# Patient Record
Sex: Female | Born: 1975 | Race: White | Hispanic: No | Marital: Married | Smoking: Never smoker
Health system: Southern US, Community
[De-identification: ages and names within clinical notes are randomized; demographics above are authoritative.]

## PROBLEM LIST (undated history)

## (undated) DIAGNOSIS — E079 Disorder of thyroid, unspecified: Secondary | ICD-10-CM

## (undated) HISTORY — PX: WISDOM TOOTH EXTRACTION: SHX21

---

## 2000-02-12 ENCOUNTER — Other Ambulatory Visit: Admission: RE | Admit: 2000-02-12 | Discharge: 2000-02-12 | Payer: Self-pay | Admitting: Obstetrics and Gynecology

## 2001-02-13 ENCOUNTER — Other Ambulatory Visit: Admission: RE | Admit: 2001-02-13 | Discharge: 2001-02-13 | Payer: Self-pay | Admitting: Obstetrics and Gynecology

## 2002-04-01 ENCOUNTER — Other Ambulatory Visit: Admission: RE | Admit: 2002-04-01 | Discharge: 2002-04-01 | Payer: Self-pay | Admitting: Gynecology

## 2006-01-31 ENCOUNTER — Other Ambulatory Visit: Admission: RE | Admit: 2006-01-31 | Discharge: 2006-01-31 | Payer: Self-pay | Admitting: Obstetrics and Gynecology

## 2009-11-08 ENCOUNTER — Ambulatory Visit (HOSPITAL_COMMUNITY): Admission: RE | Admit: 2009-11-08 | Discharge: 2009-11-08 | Payer: Self-pay | Admitting: Obstetrics and Gynecology

## 2010-04-26 ENCOUNTER — Inpatient Hospital Stay (HOSPITAL_COMMUNITY): Admission: AD | Admit: 2010-04-26 | Discharge: 2010-04-28 | Payer: Self-pay | Admitting: Obstetrics and Gynecology

## 2011-01-13 ENCOUNTER — Encounter: Payer: Self-pay | Admitting: Obstetrics and Gynecology

## 2011-03-12 LAB — CBC
HCT: 34.1 % — ABNORMAL LOW (ref 36.0–46.0)
Hemoglobin: 11.9 g/dL — ABNORMAL LOW (ref 12.0–15.0)
MCHC: 35.1 g/dL (ref 30.0–36.0)
MCHC: 35.8 g/dL (ref 30.0–36.0)
MCV: 91 fL (ref 78.0–100.0)
MCV: 93.4 fL (ref 78.0–100.0)
Platelets: 234 10*3/uL (ref 150–400)
Platelets: 257 10*3/uL (ref 150–400)
RBC: 3.32 MIL/uL — ABNORMAL LOW (ref 3.87–5.11)
RDW: 13.4 % (ref 11.5–15.5)
RDW: 13.5 % (ref 11.5–15.5)

## 2012-09-17 ENCOUNTER — Encounter (HOSPITAL_COMMUNITY): Payer: Self-pay | Admitting: Emergency Medicine

## 2012-09-17 ENCOUNTER — Emergency Department (HOSPITAL_COMMUNITY)
Admission: EM | Admit: 2012-09-17 | Discharge: 2012-09-17 | Disposition: A | Discharge status: Home / Self Care | Payer: Worker's Compensation | Attending: Emergency Medicine | Admitting: Emergency Medicine

## 2012-09-17 DIAGNOSIS — Z203 Contact with and (suspected) exposure to rabies: Secondary | ICD-10-CM

## 2012-09-17 DIAGNOSIS — E079 Disorder of thyroid, unspecified: Secondary | ICD-10-CM | POA: Insufficient documentation

## 2012-09-17 DIAGNOSIS — Z23 Encounter for immunization: Secondary | ICD-10-CM | POA: Insufficient documentation

## 2012-09-17 HISTORY — DX: Disorder of thyroid, unspecified: E07.9

## 2012-09-17 MED ORDER — RABIES VACCINE, PCEC IM SUSR
1.0000 mL | Freq: Once | INTRAMUSCULAR | Status: AC
  Administered 2012-09-17: 1 mL via INTRAMUSCULAR
  Filled 2012-09-17: qty 1

## 2012-09-17 NOTE — ED Notes (Signed)
Rabies shot schedule completed and faxed to Primary RN for patient, [facility] and pharmacy x 2.

## 2012-09-17 NOTE — ED Provider Notes (Signed)
History     CSN: [id number]  Arrival date & time 09/17/12  1456   First MD Initiated Contact with Patient 09/17/12 1559      Chief Complaint  Patient presents with  . 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 Exposure was 2 weeks ago. Dog tested inconclusively that had encephalitis and seizures. CDC recommends treatment. Patient denies symptoms and feels well. No chest pain, fever, rash, cough, nausea, vomiting or abdominal pain. She's has  been vaccinated for rabies and had positive titers in the past.  The history is provided by the patient.    Past Medical History  Diagnosis Date  . Thyroid disease     History reviewed. No pertinent past surgical history.  History reviewed. No pertinent family history.  History  Substance Use Topics  . Smoking status: Never Smoker   . Smokeless tobacco: Not on file  . Alcohol Use: Yes     occ    OB History    Grav Para Term Preterm Abortions TAB SAB Ect Mult Living                  Review of Systems  Constitutional: Negative for fever, activity change and appetite change.  HENT: Negative for congestion and rhinorrhea.   Respiratory: Negative for cough, chest tightness and shortness of breath.   Cardiovascular: Negative for chest pain.  Gastrointestinal: Negative for nausea, vomiting and abdominal pain.  Genitourinary: Negative for dysuria and pelvic pain.  Musculoskeletal: Negative for back pain.  Skin: Negative for rash.  Neurological: Negative for dizziness, weakness and headaches.    Allergies  Review of patient's allergies indicates no known allergies.  Home Medications   Current Outpatient Rx  Name Route Sig Dispense Refill  . LEVONORGESTREL 20 MCG/24HR IU IUD Intrauterine 1 each by Intrauterine route once. October 2011    . LEVOTHYROXINE  SODIUM 112 MCG PO TABS Oral Take 112 mcg by mouth daily.    . DAYQUIL PO Oral Take 1 tablet by mouth daily as needed. For cold symptoms      BP 134/91  Pulse 103  Temp 98.3 F (36.8 C) (Oral)  Resp 18  SpO2 98%  Physical Exam  Constitutional: She is oriented to person, place, and time. She appears well-developed. No distress.  HENT:  Head: Normocephalic and atraumatic.  Mouth/Throat: Oropharynx is clear and moist. No oropharyngeal exudate.  Eyes: Conjunctivae normal and EOM are normal. Pupils are equal, round, and reactive to light.  Neck: Normal range of motion. Neck supple.  Cardiovascular: Normal rate, regular rhythm and normal heart sounds.   No murmur heard. Pulmonary/Chest: Effort normal and breath sounds normal. No respiratory distress.  Abdominal: Soft. There is no tenderness. There is no rebound and no guarding.  Musculoskeletal: Normal range of motion. She exhibits no edema and no tenderness.  Neurological: She is alert and oriented to person, place, and time. No cranial nerve deficit.  Skin: Skin is warm.    ED Course  Procedures (including critical care time)  Labs Reviewed - No data to display No results found.   No diagnosis found.    MDM  Asymptomatic possible rabies exposure. Vital stable, no distress.  Patient has received vaccination series before and has known positive titers. We'll give boost dosing of vaccine today and repeat dose in 3 days. D/w poison center.        Glynn Octave,  MD 09/17/12 2004

## 2012-09-17 NOTE — ED Notes (Signed)
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

## 2012-09-17 NOTE — ED Notes (Signed)
Pt in after possible rabies exposure at work, here for vaccine series, pt has received series in the past, no known injury.

## 2012-09-20 ENCOUNTER — Emergency Department (HOSPITAL_COMMUNITY)
Admission: EM | Admit: 2012-09-20 | Discharge: 2012-09-20 | Disposition: A | Discharge status: Home / Self Care | Payer: Worker's Compensation | Source: Home / Self Care

## 2012-09-20 ENCOUNTER — Encounter (HOSPITAL_COMMUNITY): Payer: Self-pay | Admitting: *Deleted

## 2012-09-20 MED ORDER — RABIES VACCINE, PCEC IM SUSR
1.0000 mL | Freq: Once | INTRAMUSCULAR | Status: AC
  Administered 2012-09-20: 1 mL via INTRAMUSCULAR

## 2012-09-20 NOTE — ED Notes (Signed)
rabbies vaccination

## 2012-10-07 ENCOUNTER — Other Ambulatory Visit: Payer: Self-pay | Admitting: Endocrinology

## 2012-10-07 DIAGNOSIS — E010 Iodine-deficiency related diffuse (endemic) goiter: Secondary | ICD-10-CM

## 2012-11-30 ENCOUNTER — Telehealth: Payer: Self-pay | Admitting: Obstetrics and Gynecology

## 2012-12-28 ENCOUNTER — Other Ambulatory Visit: Payer: Self-pay

## 2013-01-11 ENCOUNTER — Ambulatory Visit
Admission: RE | Admit: 2013-01-11 | Discharge: 2013-01-11 | Disposition: A | Discharge status: Home / Self Care | Payer: BC Managed Care – PPO | Source: Ambulatory Visit | Attending: Endocrinology | Admitting: Endocrinology

## 2013-01-11 DIAGNOSIS — E010 Iodine-deficiency related diffuse (endemic) goiter: Secondary | ICD-10-CM

## 2013-09-20 ENCOUNTER — Other Ambulatory Visit: Payer: Self-pay | Admitting: Endocrinology

## 2013-09-20 DIAGNOSIS — E049 Nontoxic goiter, unspecified: Secondary | ICD-10-CM

## 2014-03-29 ENCOUNTER — Ambulatory Visit
Admission: RE | Admit: 2014-03-29 | Discharge: 2014-03-29 | Disposition: A | Discharge status: Home / Self Care | Payer: Self-pay | Source: Ambulatory Visit | Attending: Endocrinology | Admitting: Endocrinology

## 2014-03-29 DIAGNOSIS — E049 Nontoxic goiter, unspecified: Secondary | ICD-10-CM

## 2014-03-30 ENCOUNTER — Other Ambulatory Visit: Payer: Self-pay

## 2014-04-06 ENCOUNTER — Other Ambulatory Visit: Payer: Self-pay | Admitting: Endocrinology

## 2014-04-06 DIAGNOSIS — E041 Nontoxic single thyroid nodule: Secondary | ICD-10-CM

## 2014-10-05 ENCOUNTER — Ambulatory Visit (INDEPENDENT_AMBULATORY_CARE_PROVIDER_SITE_OTHER): Payer: BC Managed Care – PPO | Admitting: Family Medicine

## 2014-10-05 VITALS — BP 110/80 | HR 77 | Temp 98.2°F | Resp 16 | Ht 62.0 in | Wt 127.4 lb

## 2014-10-05 DIAGNOSIS — F509 Eating disorder, unspecified: Secondary | ICD-10-CM

## 2014-10-05 DIAGNOSIS — F329 Major depressive disorder, single episode, unspecified: Secondary | ICD-10-CM

## 2014-10-05 DIAGNOSIS — F418 Other specified anxiety disorders: Secondary | ICD-10-CM

## 2014-10-05 DIAGNOSIS — R42 Dizziness and giddiness: Secondary | ICD-10-CM

## 2014-10-05 DIAGNOSIS — F419 Anxiety disorder, unspecified: Secondary | ICD-10-CM

## 2014-10-05 DIAGNOSIS — E039 Hypothyroidism, unspecified: Secondary | ICD-10-CM | POA: Insufficient documentation

## 2014-10-05 LAB — POCT CBC
GRANULOCYTE PERCENT: 59 % (ref 37–80)
HCT, POC: 38.7 % (ref 37.7–47.9)
Hemoglobin: 12.9 g/dL (ref 12.2–16.2)
Lymph, poc: 2.8 (ref 0.6–3.4)
MCH, POC: 29.4 pg (ref 27–31.2)
MCHC: 33.3 g/dL (ref 31.8–35.4)
MCV: 88.4 fL (ref 80–97)
MID (CBC): 0.4 (ref 0–0.9)
MPV: 8.4 fL (ref 0–99.8)
PLATELET COUNT, POC: 250 10*3/uL (ref 142–424)
POC GRANULOCYTE: 4.7 (ref 2–6.9)
POC LYMPH %: 36 % (ref 10–50)
POC MID %: 5 %M (ref 0–12)
RBC: 4.38 M/uL (ref 4.04–5.48)
RDW, POC: 13.9 %
WBC: 7.9 10*3/uL (ref 4.6–10.2)

## 2014-10-05 LAB — GLUCOSE, POCT (MANUAL RESULT ENTRY): POC GLUCOSE: 110 mg/dL — AB (ref 70–99)

## 2014-10-05 MED ORDER — SERTRALINE HCL 50 MG PO TABS: 50.0000 mg | ORAL_TABLET | Freq: Every day | ORAL | Status: AC

## 2014-10-05 NOTE — Patient Instructions (Signed)
Start the sertraline (Zoloft) by taking 1/2 tablet each day for 1 week, then increase to the whole tablet. Follow up here in 4-6 weeks, or with the psychiatrist as planned.

## 2014-10-05 NOTE — Progress Notes (Signed)
Subjective:    Patient ID: Joaquim LaiJenny F Paulding, female    DOB: [birth year], 38 y.o.   MRN: [id number]   PCP: No PCP Per Patient  Chief Complaint  Patient presents with  . Referral    referred to Zeta Bucy for orthostatic vitals, labs, ekg.  pt feels that her heart rate is back to normal now.     Patient Active Problem List   Diagnosis Date Noted  . Hypothyroidism 10/05/2014    Prior to Admission medications   Medication Sig Start Date End Date Taking? Authorizing Provider  levonorgestrel (MIRENA) 20 MCG/24HR IUD 1 each by Intrauterine route once. October 2011   Yes Historical Provider, MD  levothyroxine (SYNTHROID, LEVOTHROID) 100 MCG tablet Take 100 mcg by mouth daily before breakfast.   Yes Historical Provider, MD  Multiple Vitamins-Minerals (MULTIVITAMIN & MINERAL PO) Take 1 tablet by mouth daily.   Yes Historical Provider, MD    HPI  This 38 y.o. female presents for evaluation of dizziness, light-headedness and erratic heart rate which she reported to her nutritionist, who advised she come see me for evaluation. Ms. Fran LowesHolder is concerned that this patient may have anorexia due to recent weight loss, use of laxatives, calorie restriction, exercise habits. Calorie intake is [phone]/day, but her estimated needs are [phone].  The patient reports weighing 180 lbs. In 12/2013. Her endocrinologist prescribed phentermine to help with weight loss due to mildly elevated blood pressure. With th 50 lb weight loss, her blood pressure has normalized and her levothyroxine dose has been reduced.  However, maintaining the weight loss is a concern for her.  She describes herself as being "obsessed with food." She has sought therapy with nutrition (Ms. Fran LowesHolder) and psychotherapy Kerri Perches(Carla Townsend) to help, recognizing that she has long-standing anxiety and depression with OCD behaviors currently focused on food.  Appointment with psychiatry scheduled for next month.  Since discontinuing phentermine, she's  using OTC HydroxyCut and caffeine to suppress her appetite, working with a trainer and exercising (weights, cardio, yoga) "not more than" 5 hours/week.  The regimen, despite a reported 96 ounces of water daily results in constipation, and she reports using 1-2 laxatives 1-2 times/week. Acknowledges binge eating, and notes that she feels "bad, tired and depressed" when she's eating only healthy things. Denies purging via emesis, laxative or exercise. Also acknowledges previous disordered eating, though this is the first time she's sought care. "If I could go all day and not eat, I'd be good. Except that I can't do that."  To me she reports the only dizziness occurs with rapid position change, like getting up from the floor, and resolves quickly (within moments). No chest pain, vision changes, lower extremity cramping.  Describes her husband as unsupportive in this. He thinks she can just decide to be different, more mellow and relaxed, more "go with the flow."   Review of Systems As above.    Objective:   Physical Exam  Constitutional: She is oriented to person, place, and time. She appears well-developed and well-nourished. She is active and cooperative. No distress.  BP 110/80  Pulse 77  Temp(Src) 98.2 F (36.8 C) (Oral)  Resp 16  Ht 5\' 2"  (1.575 m)  Wt 127 lb 6.4 oz (57.788 kg)  BMI 23.30 kg/m2  SpO2 100%   Eyes: Conjunctivae are normal.  Neck: Neck supple. No thyromegaly present.  Cardiovascular: Normal rate, regular rhythm, normal heart sounds and intact distal pulses.   Pulmonary/Chest: Effort normal and breath sounds normal.  Lymphadenopathy:  She has no cervical adenopathy.  Neurological: She is alert and oriented to person, place, and time.  Skin: Skin is warm and dry.  Psychiatric: She has a normal mood and affect. Her speech is normal and behavior is normal. Cognition and memory are not impaired. She does not express impulsivity. She expresses no homicidal and no suicidal  ideation.   EKG reviewed with Dr. Neva SeatGreene reveals mildly shortened PR interval, but no other abnormalities.    Results for orders placed in visit on 10/05/14  POCT CBC      Result Value Ref Range   WBC 7.9  4.6 - 10.2 K/uL   Lymph, poc 2.8  0.6 - 3.4   POC LYMPH PERCENT 36.0  10 - 50 %L   MID (cbc) 0.4  0 - 0.9   POC MID % 5.0  0 - 12 %M   POC Granulocyte 4.7  2 - 6.9   Granulocyte percent 59.0  37 - 80 %G   RBC 4.38  4.04 - 5.48 M/uL   Hemoglobin 12.9  12.2 - 16.2 g/dL   HCT, POC 16.138.7  09.637.7 - 47.9 %   MCV 88.4  80 - 97 fL   MCH, POC 29.4  27 - 31.2 pg   MCHC 33.3  31.8 - 35.4 g/dL   RDW, POC 04.513.9     Platelet Count, POC 250  142 - 424 K/uL   MPV 8.4  0 - 99.8 fL  GLUCOSE, POCT (MANUAL RESULT ENTRY)      Result Value Ref Range   POC Glucose 110 (*) 70 - 99 mg/dl       Assessment & Plan:  1. Dizziness; anxiety/depression with OCD Await CMET. Discussed concern for disordered eating, and need to help her make healthy eating choices, reduce laxative use, and eliminate appetite suppressant use.  Continue with therapy and nutrition. Proceed with psychiatry appointment next month as planned-I am happy to see her in the interim. - sertraline (ZOLOFT) 50 MG tablet; Take 1 tablet (50 mg total) by mouth daily.  Dispense: 30 tablet; Refill: 3 - POCT CBC - POCT glucose (manual entry) - Comprehensive metabolic panel - EKG 12-Lead  2. Hypothyroidism, unspecified hypothyroidism type Continue follow-up with endocrinology WU:[id number]:thyroid dysfunction.   Fernande Brashelle S. Harshaan Whang, PA-C Physician Assistant-Certified [facility]

## 2014-10-06 DIAGNOSIS — F509 Eating disorder, unspecified: Secondary | ICD-10-CM | POA: Insufficient documentation

## 2014-10-06 DIAGNOSIS — F419 Anxiety disorder, unspecified: Secondary | ICD-10-CM

## 2014-10-06 DIAGNOSIS — F329 Major depressive disorder, single episode, unspecified: Secondary | ICD-10-CM | POA: Insufficient documentation

## 2014-10-06 DIAGNOSIS — F32A Depression, unspecified: Secondary | ICD-10-CM | POA: Insufficient documentation

## 2014-10-06 LAB — COMPREHENSIVE METABOLIC PANEL
ALK PHOS: 51 U/L (ref 39–117)
ALT: 30 U/L (ref 0–35)
AST: 33 U/L (ref 0–37)
Albumin: 4.6 g/dL (ref 3.5–5.2)
BILIRUBIN TOTAL: 0.7 mg/dL (ref 0.2–1.2)
BUN: 21 mg/dL (ref 6–23)
CO2: 28 mEq/L (ref 19–32)
Calcium: 10.1 mg/dL (ref 8.4–10.5)
Chloride: 100 mEq/L (ref 96–112)
Creat: 1.36 mg/dL — ABNORMAL HIGH (ref 0.50–1.10)
GLUCOSE: 102 mg/dL — AB (ref 70–99)
Potassium: 4.1 mEq/L (ref 3.5–5.3)
SODIUM: 139 meq/L (ref 135–145)
TOTAL PROTEIN: 7 g/dL (ref 6.0–8.3)

## 2014-10-06 NOTE — Progress Notes (Signed)
EKG read and patient discussed with Ms. Leotis ShamesJeffery. Agree with assessment and plan of care per her note.

## 2015-03-28 ENCOUNTER — Ambulatory Visit
Admission: RE | Admit: 2015-03-28 | Discharge: 2015-03-28 | Disposition: A | Discharge status: Home / Self Care | Payer: BLUE CROSS/BLUE SHIELD | Source: Ambulatory Visit | Attending: Endocrinology | Admitting: Endocrinology

## 2015-03-28 DIAGNOSIS — E041 Nontoxic single thyroid nodule: Secondary | ICD-10-CM

## 2015-03-29 ENCOUNTER — Other Ambulatory Visit: Payer: Self-pay

## 2015-04-03 ENCOUNTER — Other Ambulatory Visit: Payer: Self-pay

## 2015-04-10 ENCOUNTER — Other Ambulatory Visit: Payer: Self-pay | Admitting: Endocrinology

## 2015-04-11 ENCOUNTER — Other Ambulatory Visit: Payer: Self-pay | Admitting: Endocrinology

## 2015-04-11 DIAGNOSIS — E049 Nontoxic goiter, unspecified: Secondary | ICD-10-CM

## 2015-04-20 ENCOUNTER — Other Ambulatory Visit: Payer: Self-pay

## 2015-12-07 ENCOUNTER — Other Ambulatory Visit: Payer: Self-pay | Admitting: Endocrinology

## 2015-12-07 DIAGNOSIS — E049 Nontoxic goiter, unspecified: Secondary | ICD-10-CM

## 2015-12-20 ENCOUNTER — Ambulatory Visit
Admission: RE | Admit: 2015-12-20 | Discharge: 2015-12-20 | Disposition: A | Discharge status: Home / Self Care | Payer: BLUE CROSS/BLUE SHIELD | Source: Ambulatory Visit | Attending: Endocrinology | Admitting: Endocrinology

## 2015-12-20 DIAGNOSIS — E049 Nontoxic goiter, unspecified: Secondary | ICD-10-CM

## 2017-01-04 IMAGING — US US SOFT TISSUE HEAD/NECK
1 series · 13 of 25 positions shown · non-contrast
Comparison: 03/29/2014; 01/11/2013; 10/29/2009

CLINICAL DATA: Follow-up thyroid nodules

EXAM:
THYROID ULTRASOUND
TECHNIQUE: Ultrasound examination of the thyroid gland and adjacent soft
tissues was performed.

[Series 1: us soft tissue head/neck · 0.09mm/px · 13 of 42 slices shown]
[im 1/42]
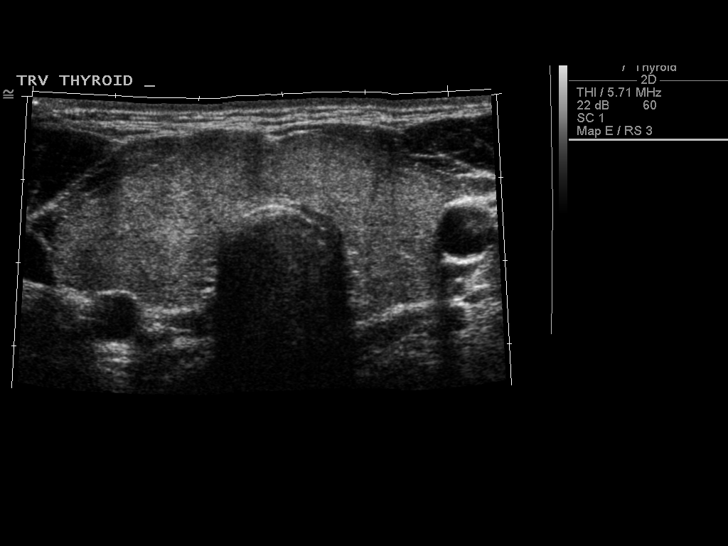
[im 4/42]
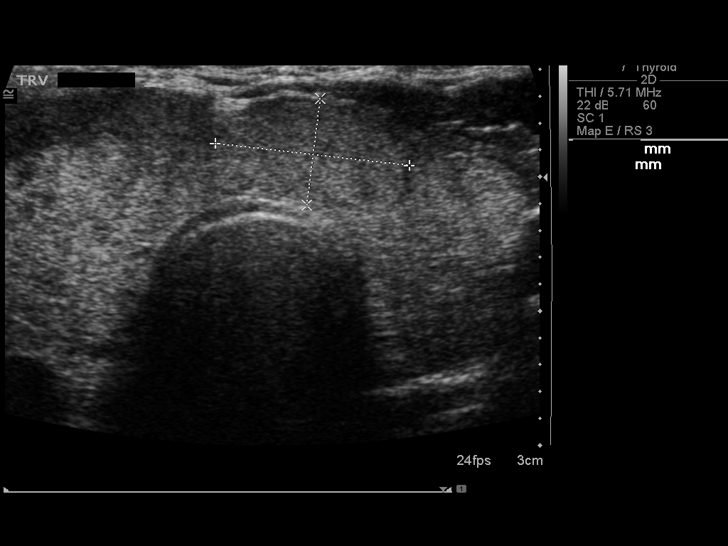
[im 7/42]
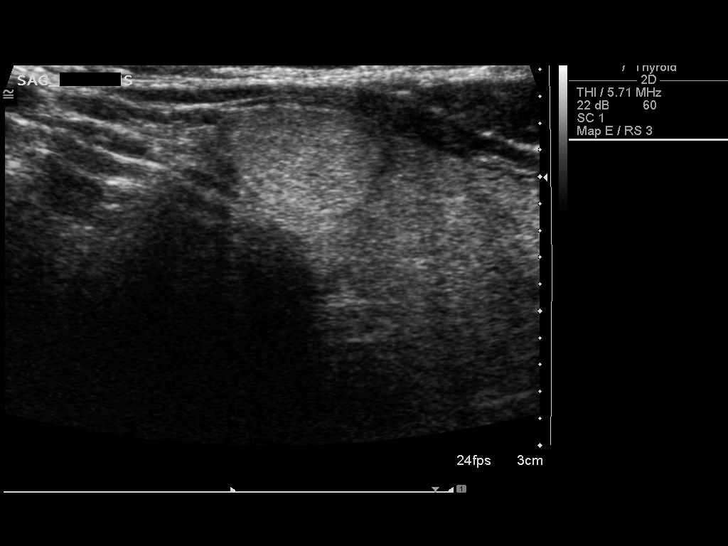
[im 11/42]
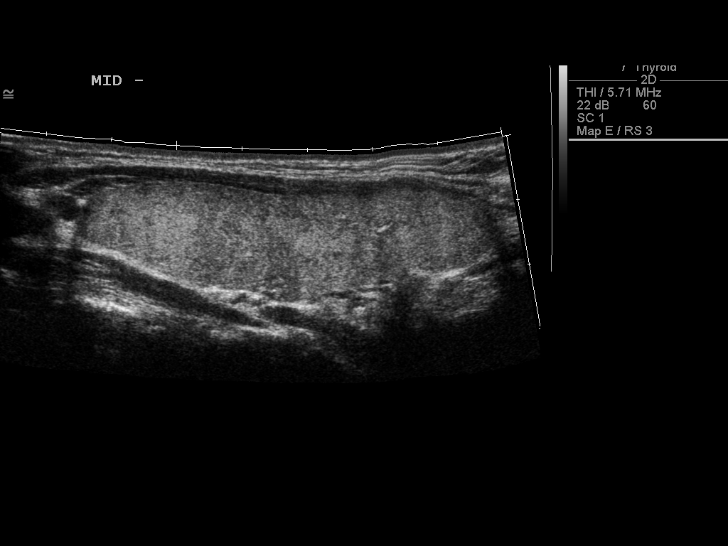
[im 14/42]
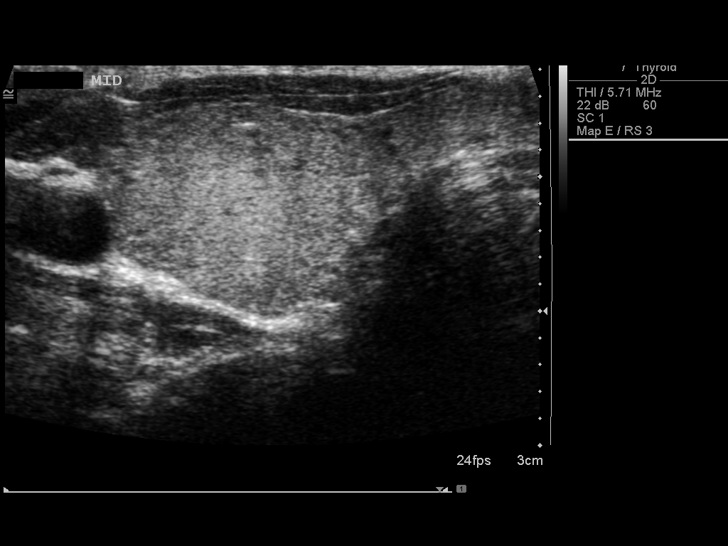
[im 18/42]
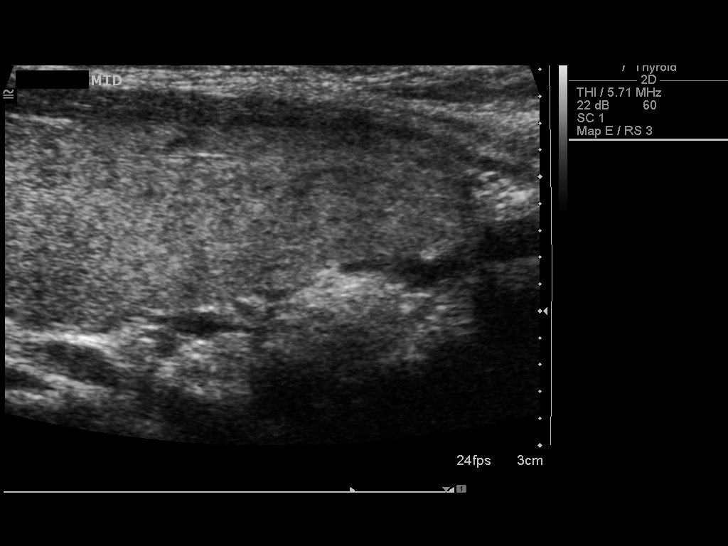
[im 21/42]
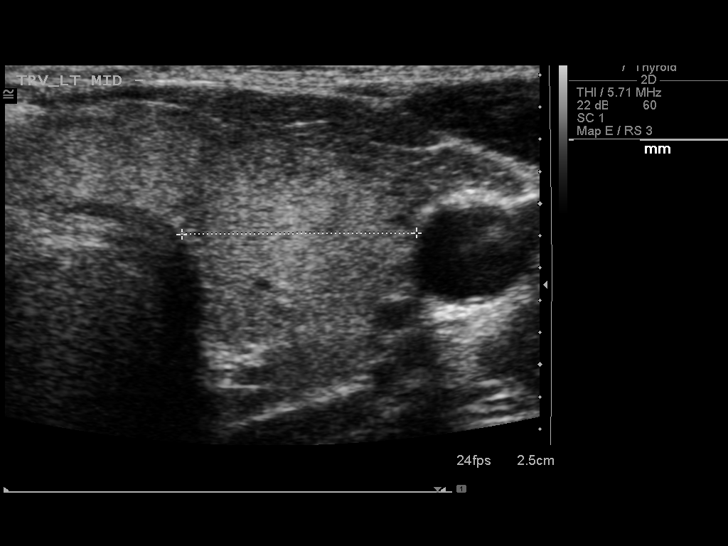
[im 24/42]
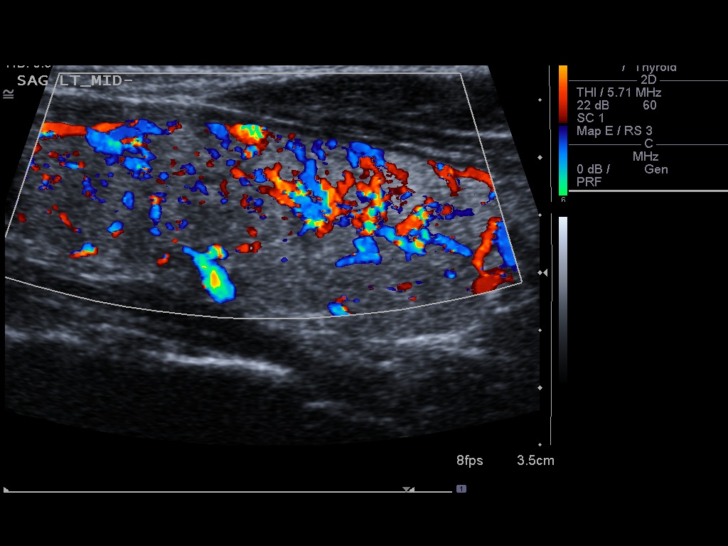
[im 28/42]
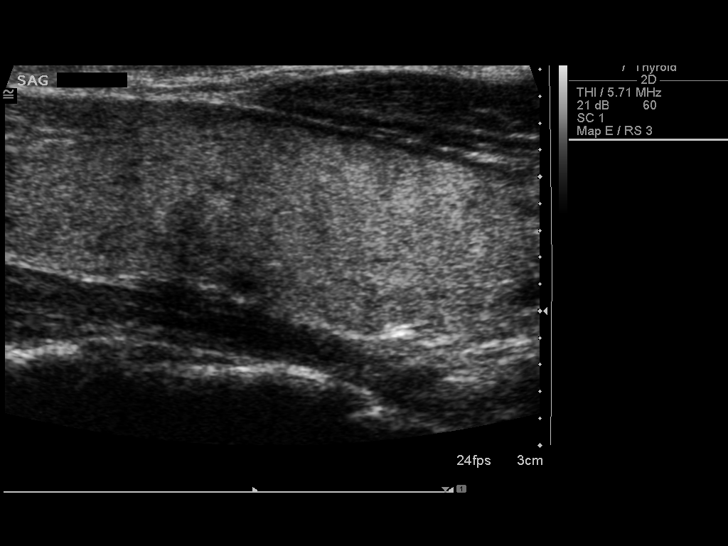
[im 31/42]
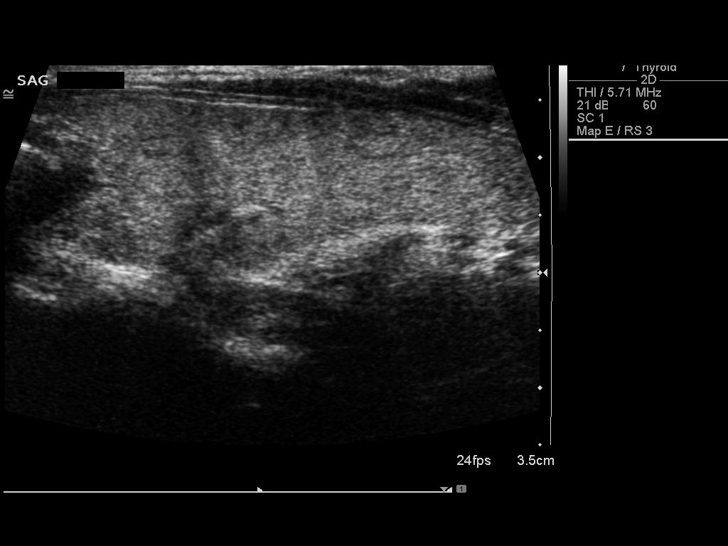
[im 35/42]
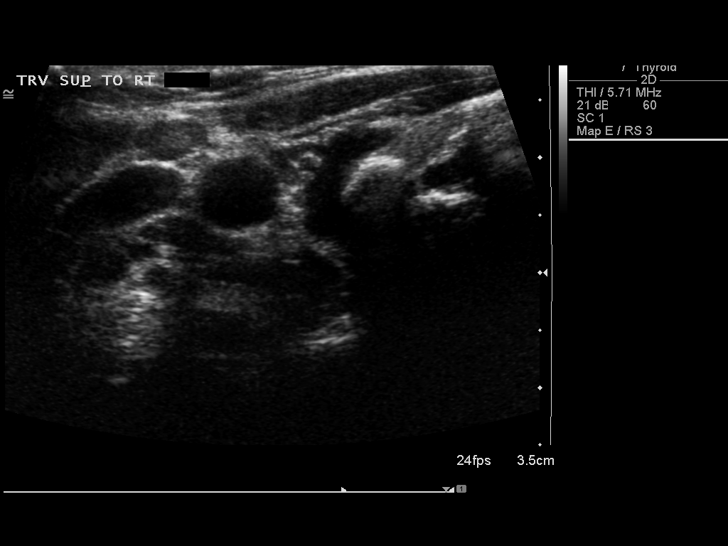
[im 38/42]
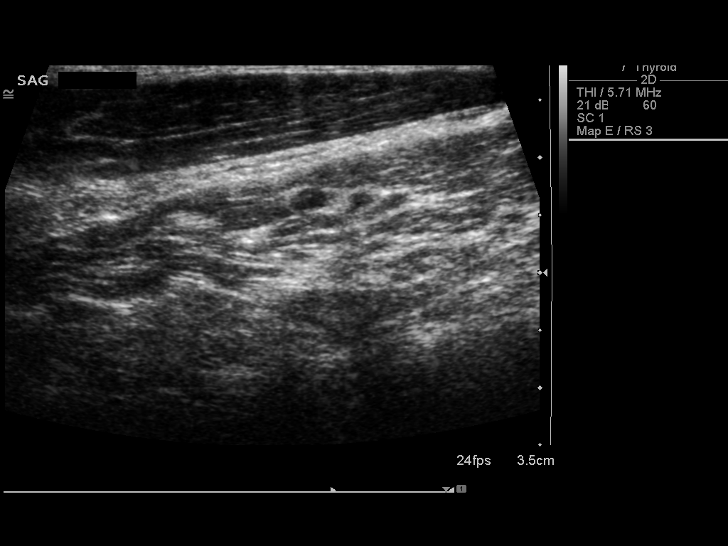
[im 42/42]
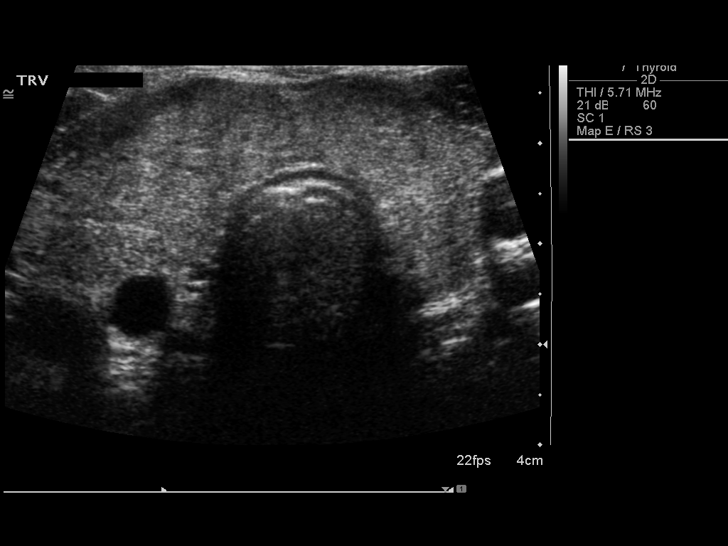

[13 of 25 positions shown; findings below may reference images not displayed]

FINDINGS: Right thyroid lobe

Measurements: Enlarged measuring 6.5 x 1.8 x 2.5 cm, unchanged,
previously, 6.2 x 1.9 x 2.0 cm.

There is mild diffuse heterogeneity of the right lobe of the thyroid
without discrete nodule or mass.

Left thyroid lobe

Measurements: Borderline enlarged measuring 4.9 x 1.9 x 1.5 cm,
grossly unchanged, previously, 5.4 x 2.4 x 1.3 cm.

There is mild diffuse heterogeneity the left lobe of the thyroid
without discrete nodule or mass.

Isthmus

Thickness: Enlarged measuring 0.8 cm in diameter, unchanged,
previously, 1.1 cm.

Left, lateral - 1.5 x 0.8 x 1.2 cm - mixed echogenic, solid,
minimally increased in size, previously, 1.1 x 0.8 x 1.1 cm (when
compared to the 03/29/2014 examination however note, this nodule was
previously stable dating back to the [DATE] examination).

Lymphadenopathy

None visualized.
IMPRESSION: Slight interval enlargement of solitary nodule within the thyroid
isthmus, currently measuring 1.5 cm in diameter, previously, 1.1 cm.
Note, this nodule had been previously stable since the 6373
examination. Potential imaging strategies include proceeding with
ultrasound-guided fine-needle aspiration versus continued
surveillance with a follow-up thyroid ultrasound in 6 months.

## 2017-09-28 IMAGING — US US SOFT TISSUE HEAD/NECK
1 series · 14 of 25 positions shown · non-contrast
Comparison: 03/28/2015

CLINICAL DATA: Goiter

EXAM:
THYROID ULTRASOUND
TECHNIQUE: Ultrasound examination of the thyroid gland and adjacent soft
tissues was performed.

[Series 1: us soft tissue head/neck · 0.06mm/px · 14 of 39 slices shown]
[im 1/39]
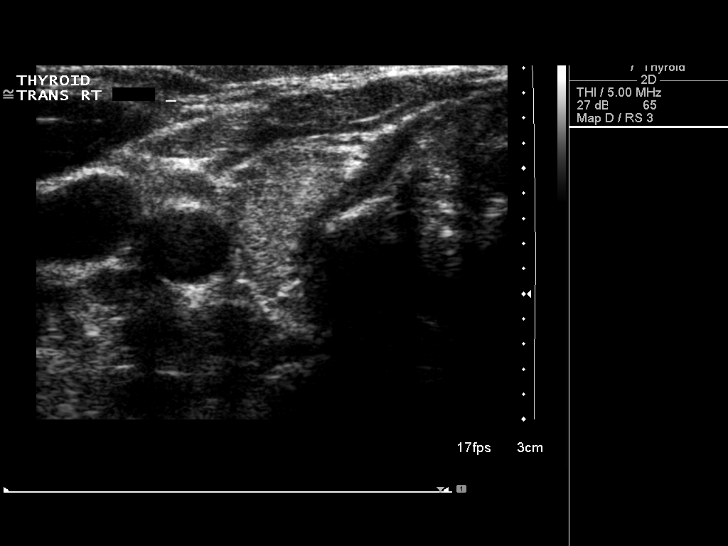
[im 4/39]
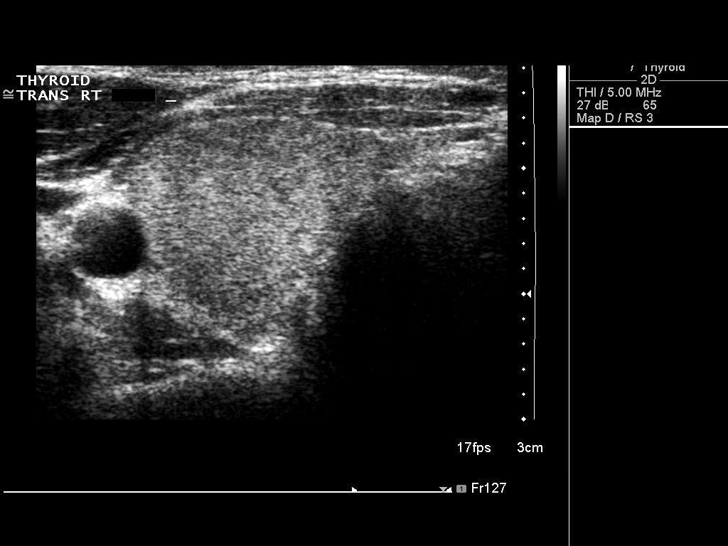
[im 7/39]
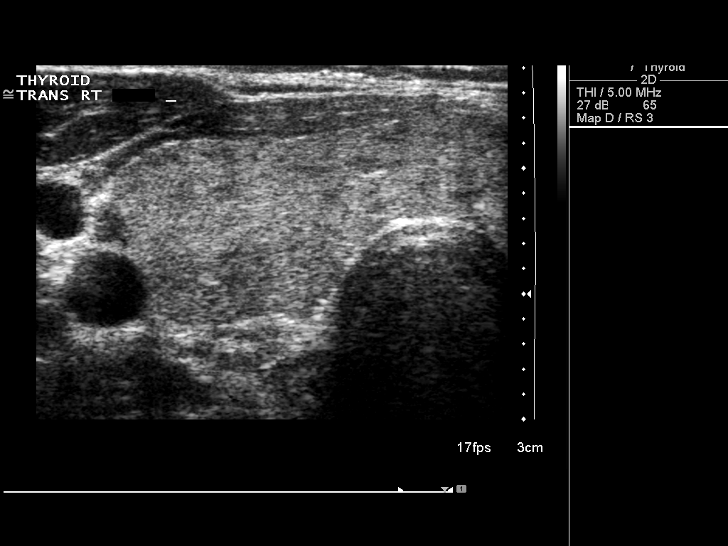
[im 10/39]
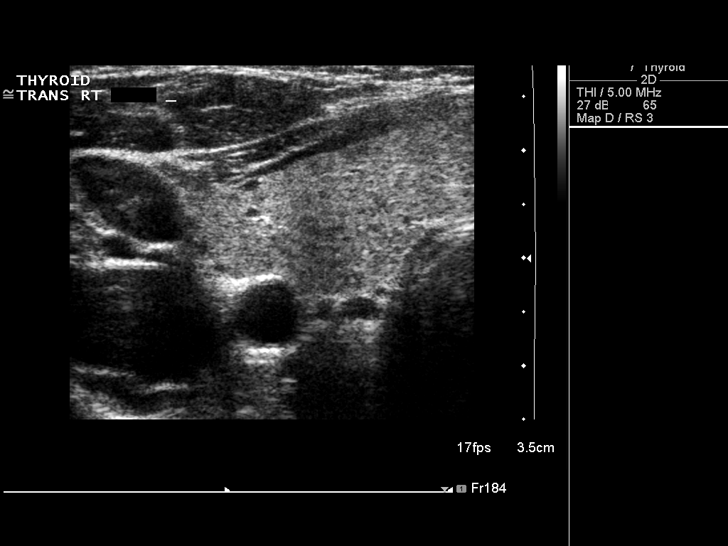
[im 13/39]
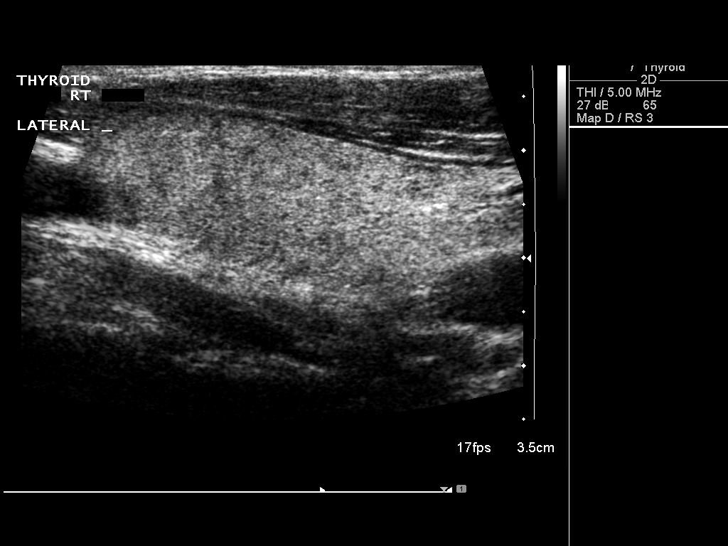
[im 15/39]
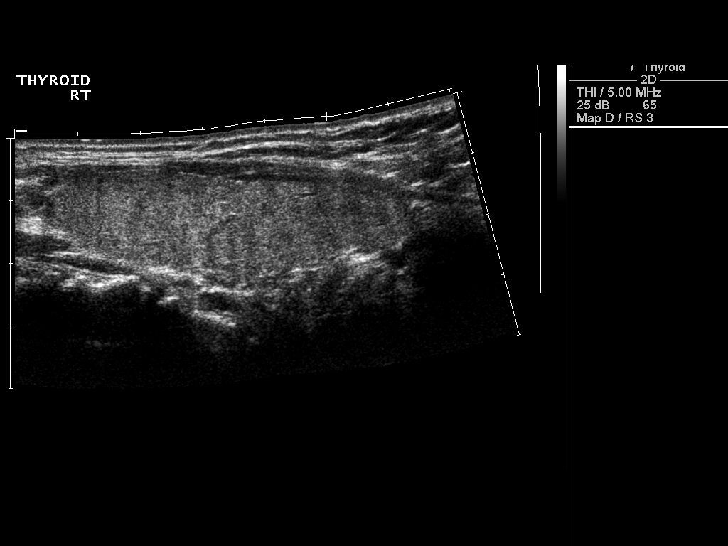
[im 18/39]
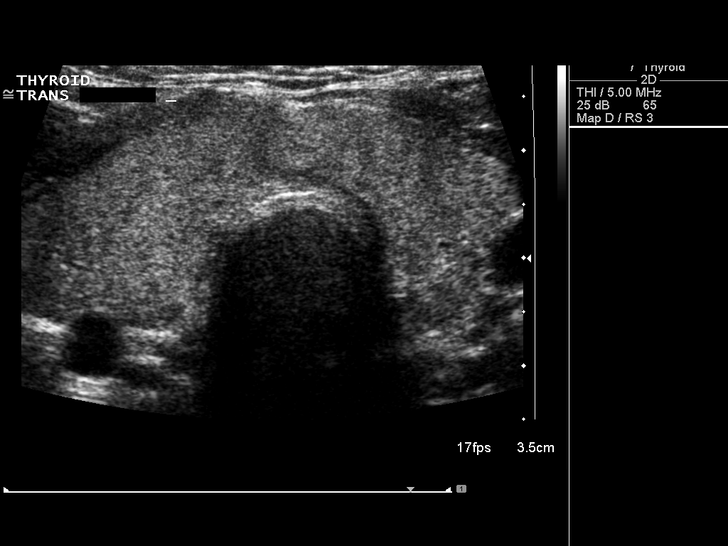
[im 21/39]
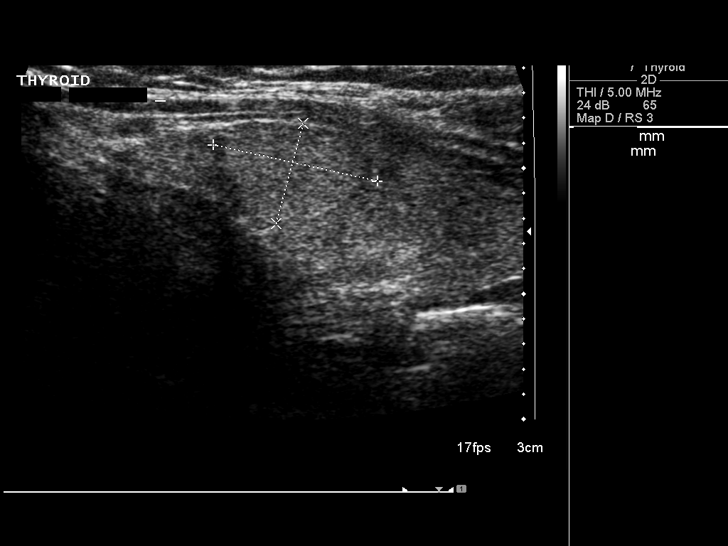
[im 24/39]
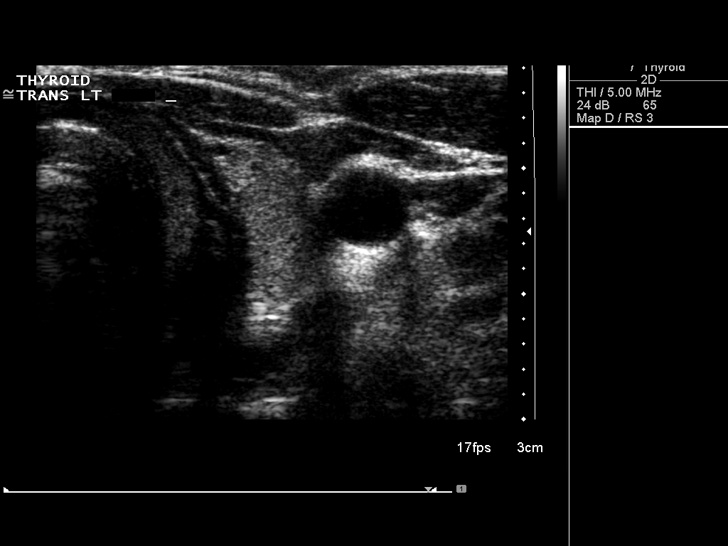
[im 26/39]
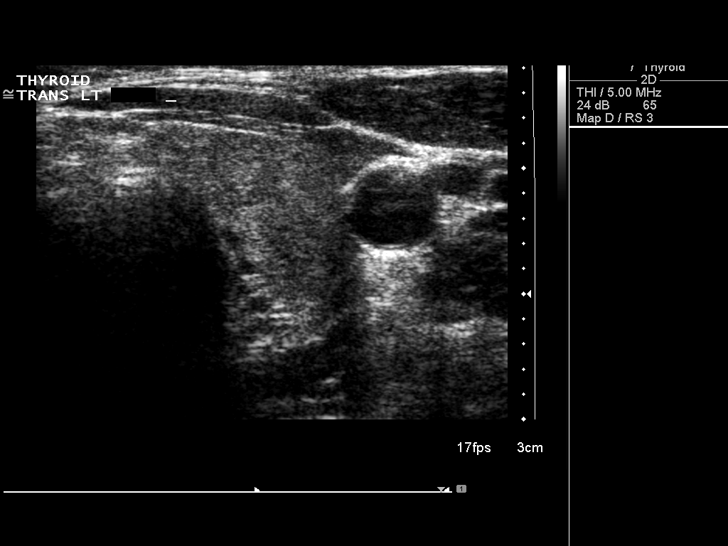
[im 29/39]
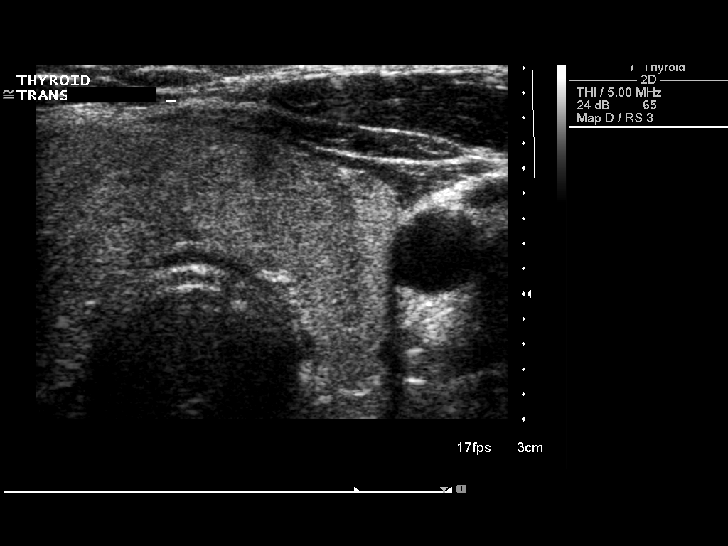
[im 32/39]
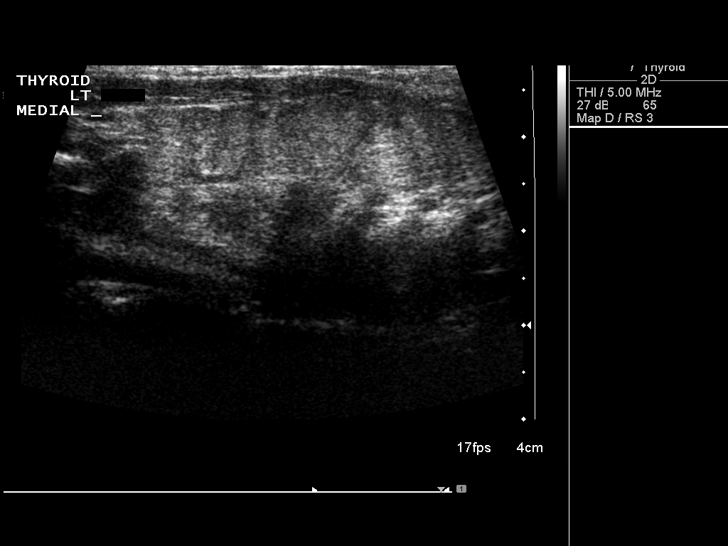
[im 35/39]
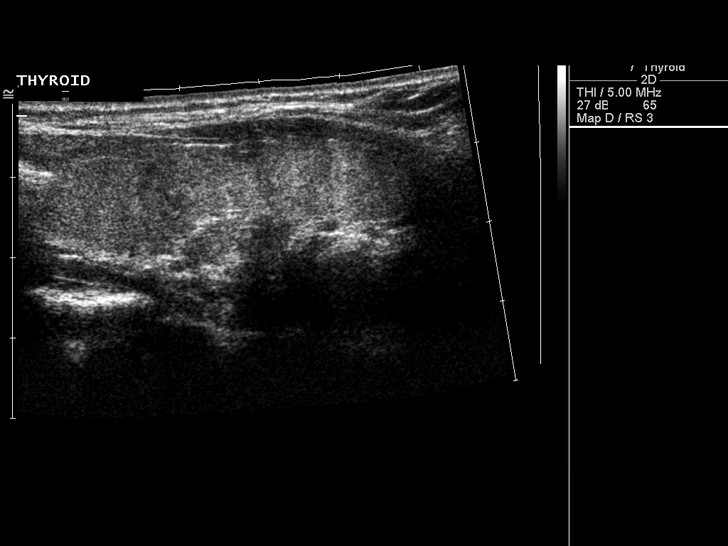
[im 39/39]
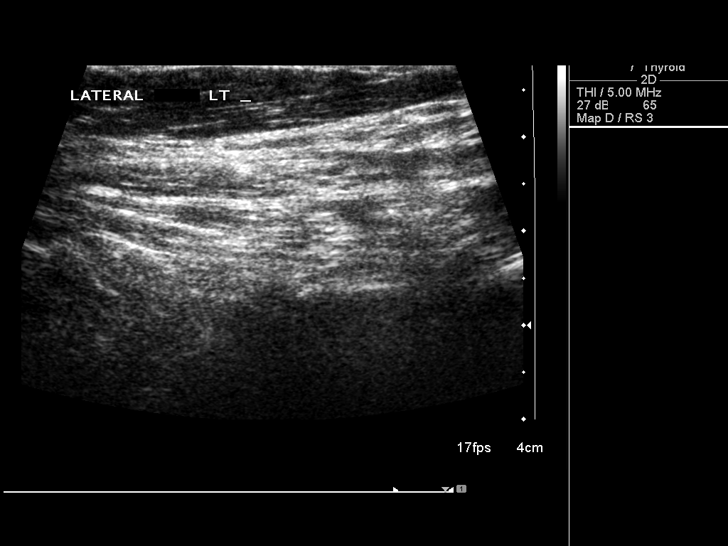

[14 of 25 positions shown; findings below may reference images not displayed]

FINDINGS: Right thyroid lobe

Measurements: 6.4 x 1.8 x 2.3 cm.  Heterogeneous without nodule.

Left thyroid lobe

Measurements: 5.0 x 1.6 x 1.3 cm.  Heterogeneous without nodule.

Isthmus

Thickness: 7 mm. Solid isoechoic nodule measures 1.3 x 0.8 x 1.3 cm
and previously measured 1.5 x 0.8 x 1.2 cm.

Lymphadenopathy

None visualized.
IMPRESSION: Stable solitary isthmic nodule today measuring up to 1.3 cm. This
has been stable since 5999. This supports benign etiology.

## 2017-10-30 ENCOUNTER — Other Ambulatory Visit: Payer: Self-pay | Admitting: Endocrinology

## 2017-10-30 DIAGNOSIS — E049 Nontoxic goiter, unspecified: Secondary | ICD-10-CM

## 2017-11-17 ENCOUNTER — Ambulatory Visit
Admission: RE | Admit: 2017-11-17 | Discharge: 2017-11-17 | Disposition: A | Discharge status: Home / Self Care | Payer: 59 | Source: Ambulatory Visit | Attending: Endocrinology | Admitting: Endocrinology

## 2017-11-17 DIAGNOSIS — E049 Nontoxic goiter, unspecified: Secondary | ICD-10-CM

## 2019-04-07 ENCOUNTER — Ambulatory Visit: Payer: Self-pay | Admitting: *Deleted

## 2019-04-07 NOTE — Telephone Encounter (Signed)
Pt called with general questions regarding Covid-19. She was not having symptoms. Referred her to the [facility] question line. Pt voiced understanding.

## 2019-06-01 ENCOUNTER — Other Ambulatory Visit: Payer: Self-pay | Admitting: Endocrinology

## 2019-06-01 DIAGNOSIS — E049 Nontoxic goiter, unspecified: Secondary | ICD-10-CM

## 2019-09-11 IMAGING — US US THYROID
1 series · 13 of 25 positions shown · non-contrast
Comparison: 12/20/2015

CLINICAL DATA: Goiter.

EXAM:
THYROID ULTRASOUND
TECHNIQUE: Ultrasound examination of the thyroid gland and adjacent soft
tissues was performed.

[Series 1: us thyroid · 0.08mm/px · 13 of 50 slices shown]
[im 1/50]
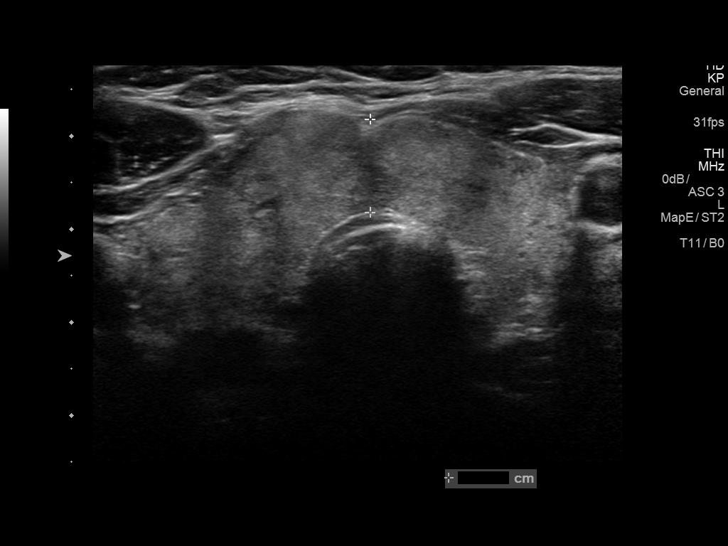
[im 5/50]
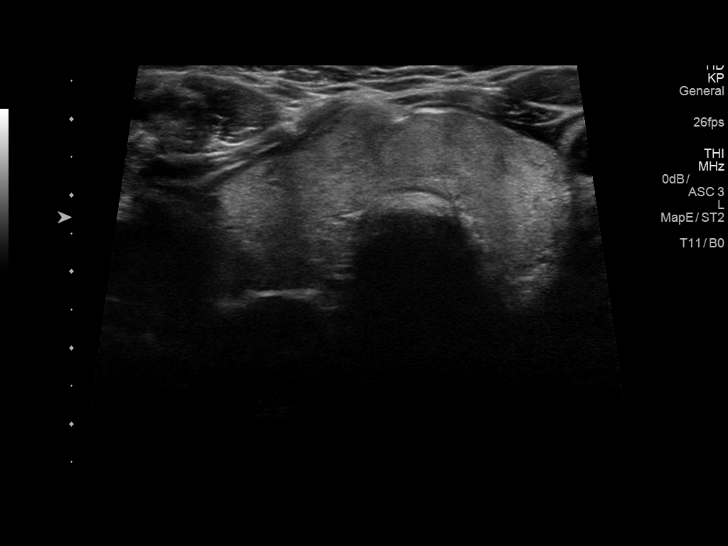
[im 9/50]
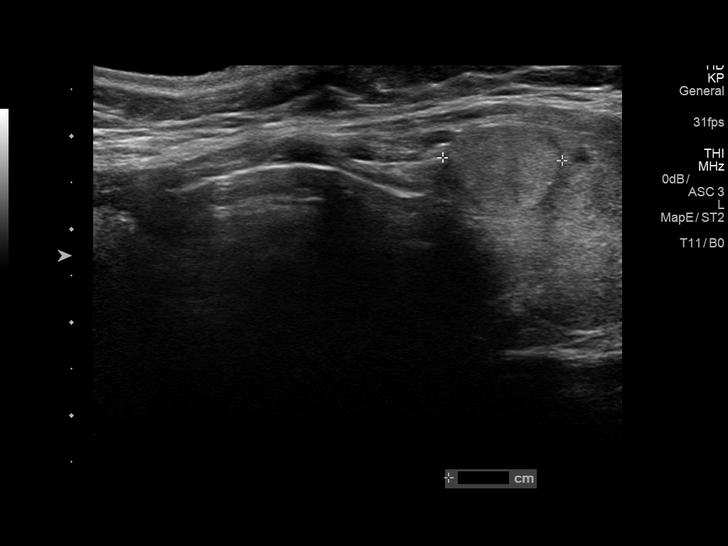
[im 13/50]
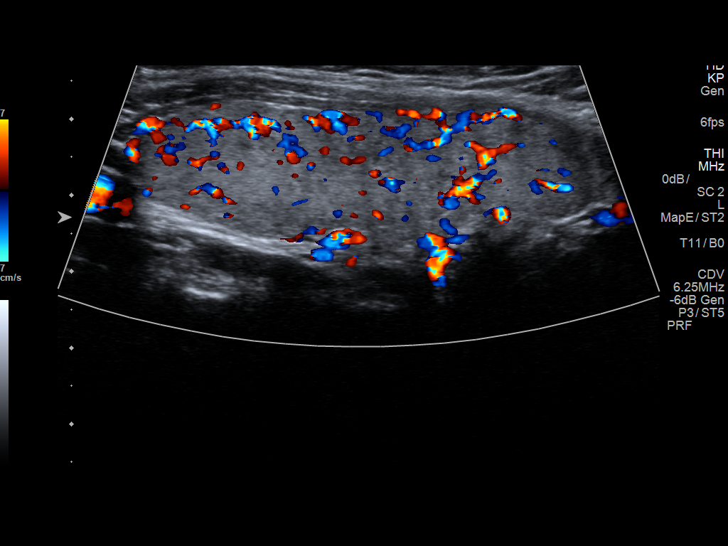
[im 17/50]
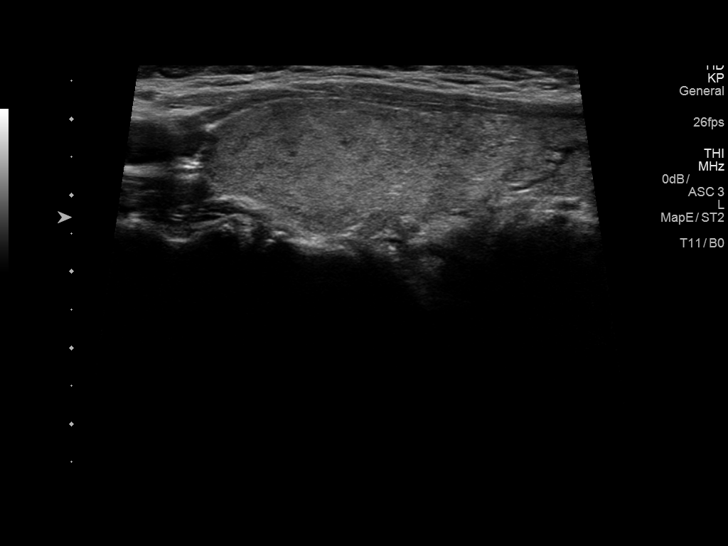
[im 21/50]
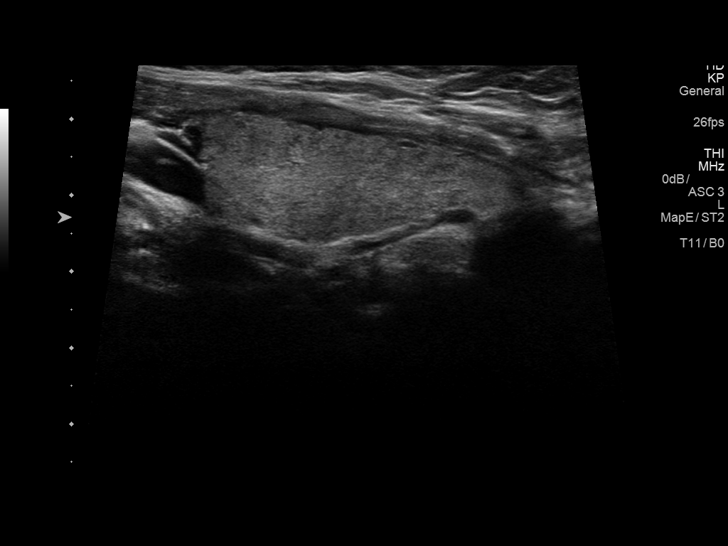
[im 25/50]
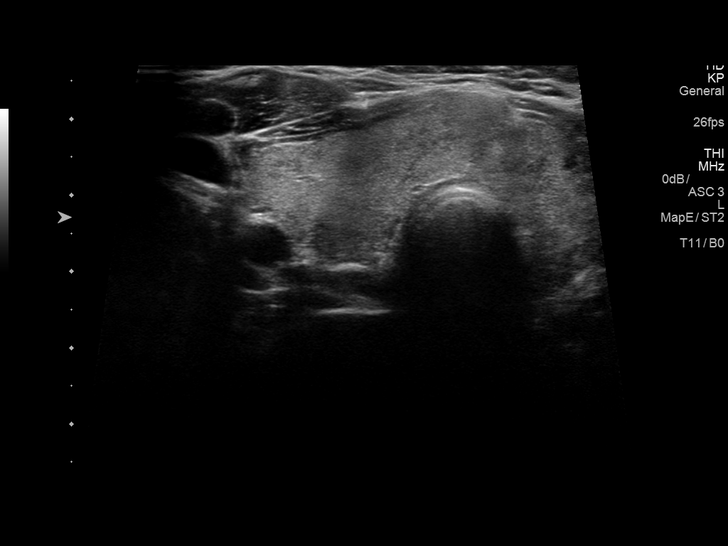
[im 29/50]
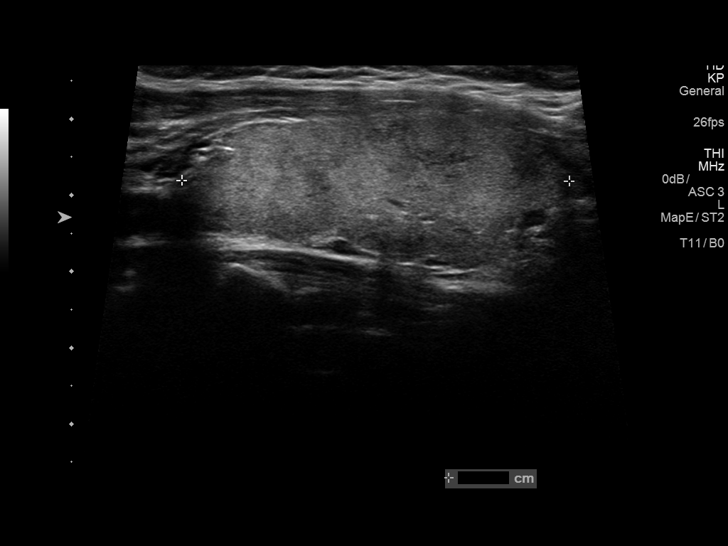
[im 33/50]
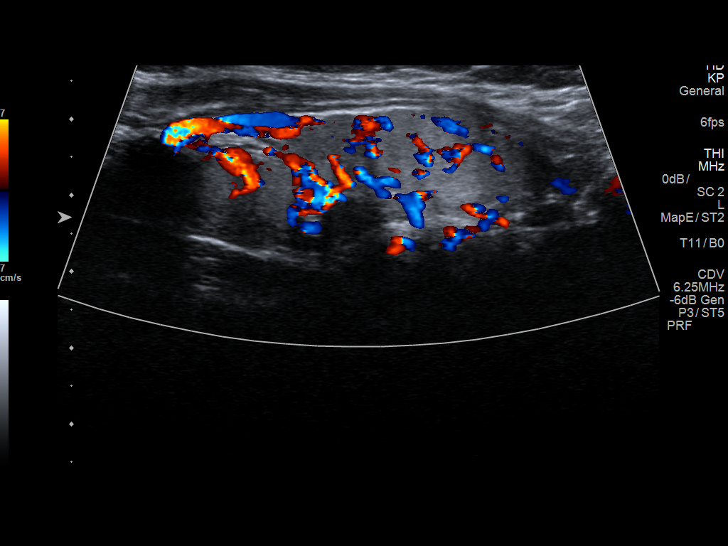
[im 37/50]
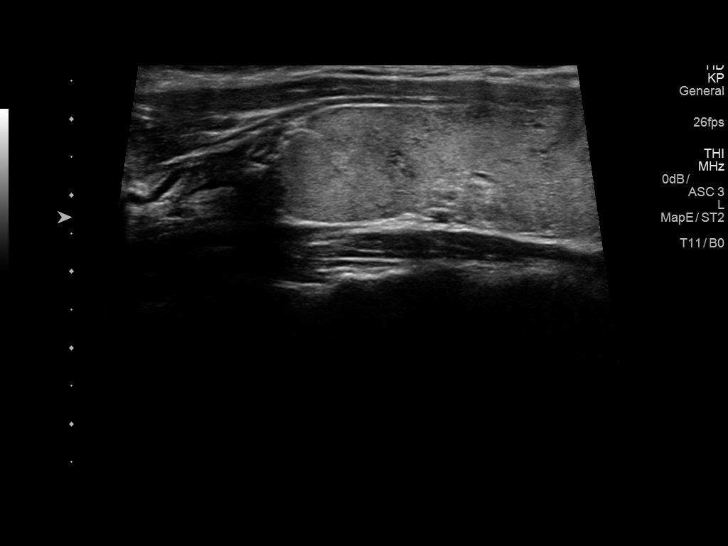
[im 41/50]
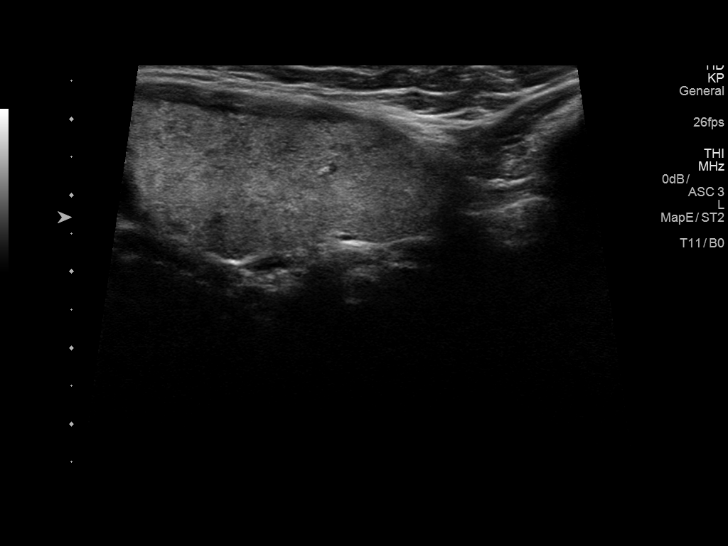
[im 45/50]
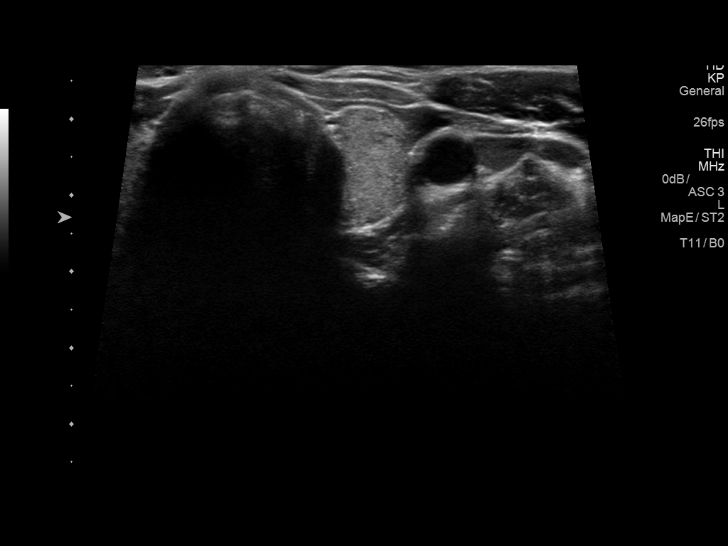
[im 50/50]
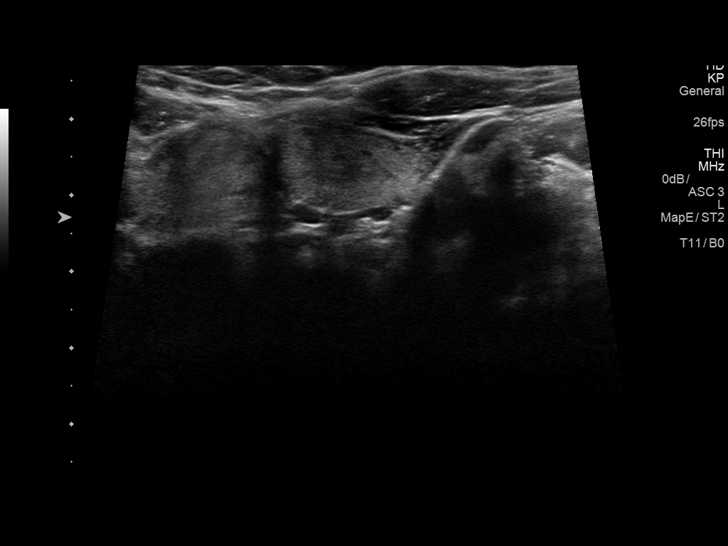

[13 of 25 positions shown; findings below may reference images not displayed]

FINDINGS: Parenchymal Echotexture: Mildly heterogenous

Isthmus: 1 cm, previously 0.7 cm

Right lobe: 6.0 x 1.8 x 2.3 cm, previously 6.4 x 1.8 x 2.3 cm

Left lobe: 5.1 x 1.6 x 1.7 cm, previously 5.0 x 1.6 x 1.3 cm

_________________________________________________________

Estimated total number of nodules >/= 1 cm: 1

Number of spongiform nodules >/=  2 cm not described below (TR1): 0

Number of mixed cystic and solid nodules >/= 1.5 cm not described
below (TR2): 0

_________________________________________________________

Nodule # 1:

Location: Isthmus; Mid

Maximum size: 1.5, previously 1.3 cm; Other 2 dimensions: 0.8 x 1.3,
unchanged cm

Composition: solid/almost completely solid (2)

Echogenicity: isoechoic (1)

Shape: not taller-than-wide (0)

Margins: ill-defined (0)

Echogenic foci: none (0)

ACR TI-RADS total points: 3.

ACR TI-RADS risk category: TR3 (3 points).

ACR TI-RADS recommendations:

*Given size (>/= 1.5 - 2.4 cm) and appearance, a follow-up
ultrasound in 1 year should be considered based on TI-RADS criteria.

_________________________________________________________

Stable mild heterogeneous enlargement of the entire thyroid gland.
Scattered parenchymal dystrophic calcifications noted. No
adenopathy.
IMPRESSION: 1.5 cm isthmus TR 3 nodule meets criteria for follow-up in 1 year.

The above is in keeping with the ACR TI-RADS recommendations - [HOSPITAL] 4887;[DATE].

## 2020-05-18 ENCOUNTER — Other Ambulatory Visit: Payer: Self-pay | Admitting: Endocrinology

## 2020-05-18 DIAGNOSIS — E049 Nontoxic goiter, unspecified: Secondary | ICD-10-CM

## 2020-07-12 ENCOUNTER — Ambulatory Visit
Admission: RE | Admit: 2020-07-12 | Discharge: 2020-07-12 | Disposition: A | Discharge status: Home / Self Care | Payer: 59 | Source: Ambulatory Visit | Attending: Endocrinology | Admitting: Endocrinology

## 2020-07-12 DIAGNOSIS — E049 Nontoxic goiter, unspecified: Secondary | ICD-10-CM

## 2021-04-09 DIAGNOSIS — F4322 Adjustment disorder with anxiety: Secondary | ICD-10-CM | POA: Diagnosis not present

## 2021-05-02 DIAGNOSIS — F4322 Adjustment disorder with anxiety: Secondary | ICD-10-CM | POA: Diagnosis not present

## 2021-05-16 DIAGNOSIS — F4322 Adjustment disorder with anxiety: Secondary | ICD-10-CM | POA: Diagnosis not present

## 2021-05-29 DIAGNOSIS — E049 Nontoxic goiter, unspecified: Secondary | ICD-10-CM | POA: Diagnosis not present

## 2021-05-29 DIAGNOSIS — R635 Abnormal weight gain: Secondary | ICD-10-CM | POA: Diagnosis not present

## 2021-05-29 DIAGNOSIS — E039 Hypothyroidism, unspecified: Secondary | ICD-10-CM | POA: Diagnosis not present

## 2021-06-06 DIAGNOSIS — F4322 Adjustment disorder with anxiety: Secondary | ICD-10-CM | POA: Diagnosis not present

## 2021-06-13 DIAGNOSIS — Z124 Encounter for screening for malignant neoplasm of cervix: Secondary | ICD-10-CM | POA: Diagnosis not present

## 2021-06-13 DIAGNOSIS — Z01419 Encounter for gynecological examination (general) (routine) without abnormal findings: Secondary | ICD-10-CM | POA: Diagnosis not present

## 2021-06-13 DIAGNOSIS — Z6833 Body mass index (BMI) 33.0-33.9, adult: Secondary | ICD-10-CM | POA: Diagnosis not present

## 2021-06-13 DIAGNOSIS — Z1231 Encounter for screening mammogram for malignant neoplasm of breast: Secondary | ICD-10-CM | POA: Diagnosis not present

## 2022-04-21 IMAGING — US US THYROID
1 series · 13 of 25 positions shown · non-contrast
Comparison: 11/17/2017; 12/20/2015; 03/28/2015

CLINICAL DATA: Goiter.

EXAM:
THYROID ULTRASOUND
TECHNIQUE: Ultrasound examination of the thyroid gland and adjacent soft
tissues was performed.

[Series 1: us thyroid · 0.05mm/px · 13 of 53 slices shown]
[im 1/53]
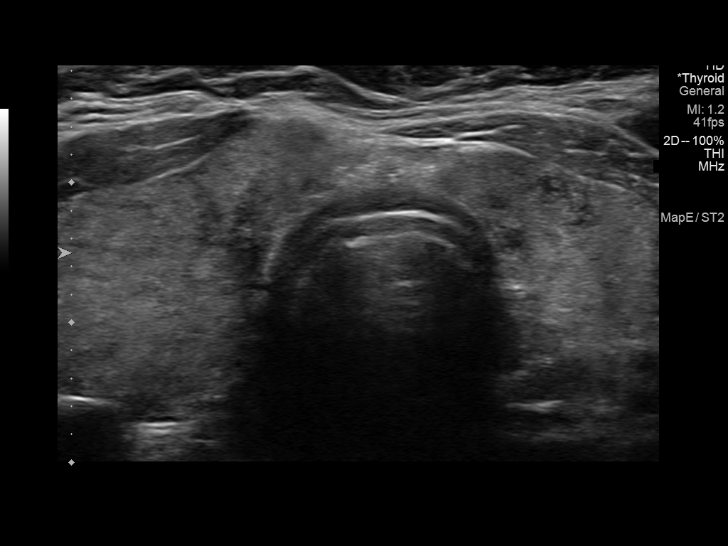
[im 5/53]
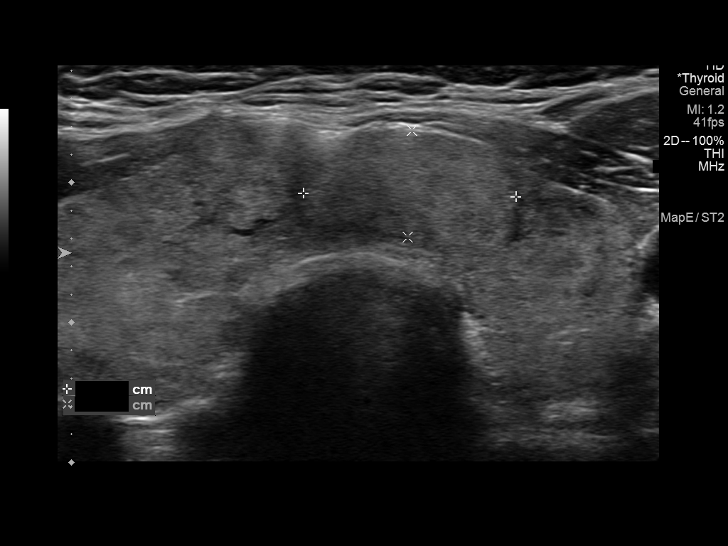
[im 9/53]
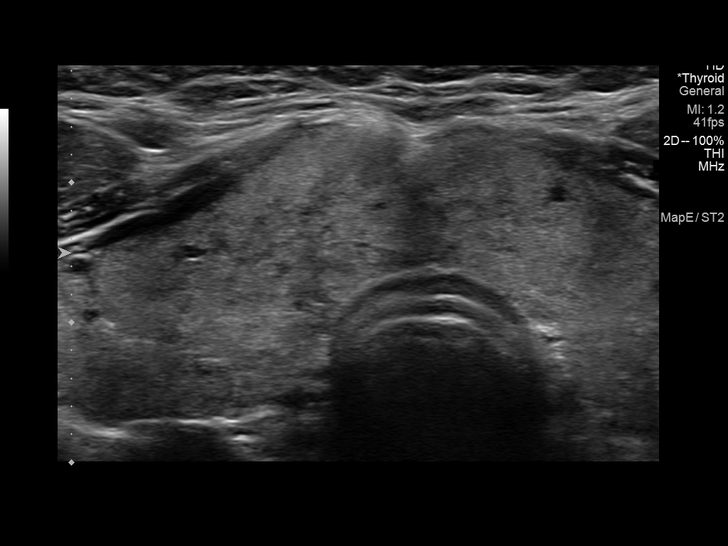
[im 14/53]
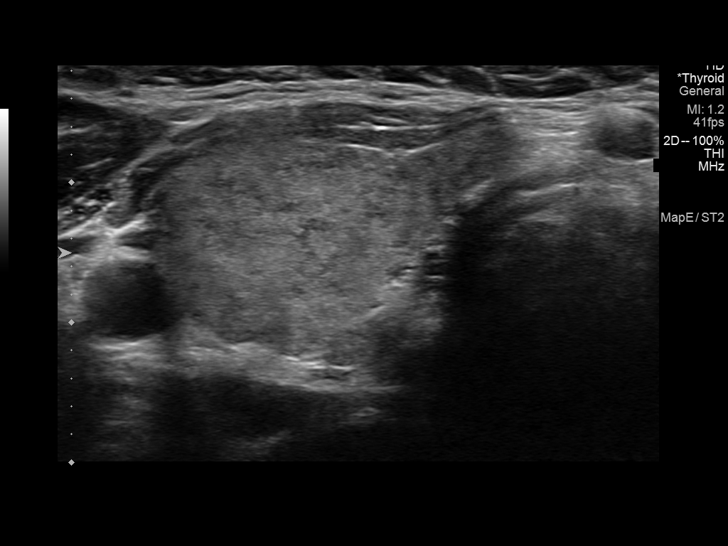
[im 18/53]
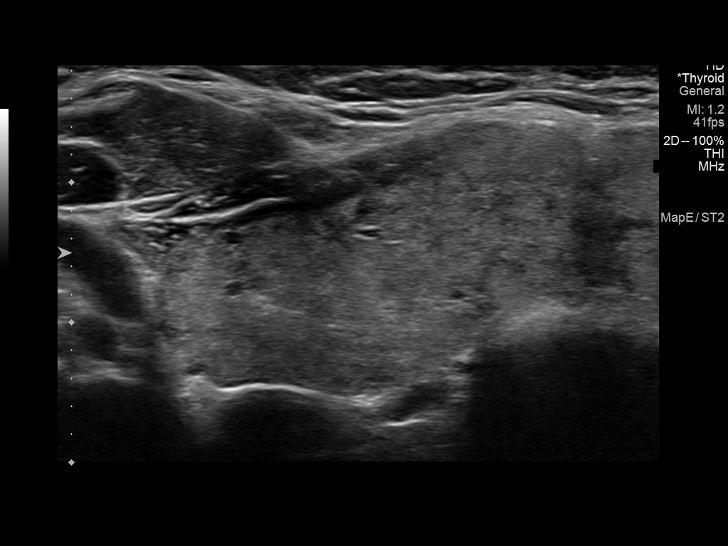
[im 22/53]
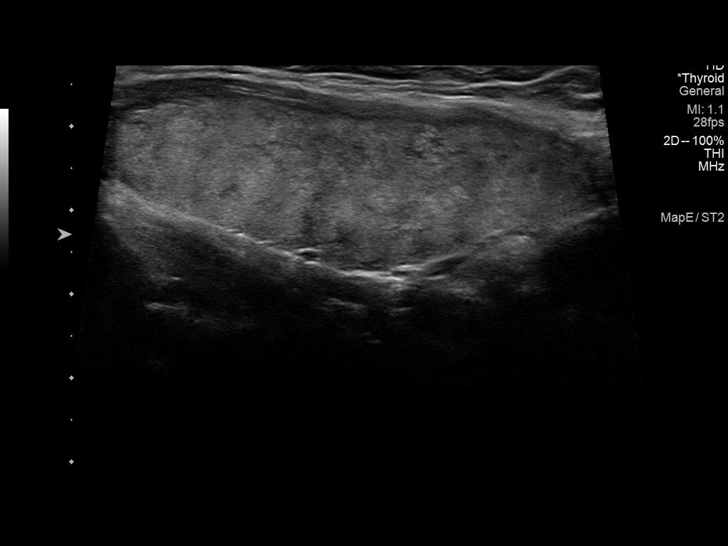
[im 27/53]
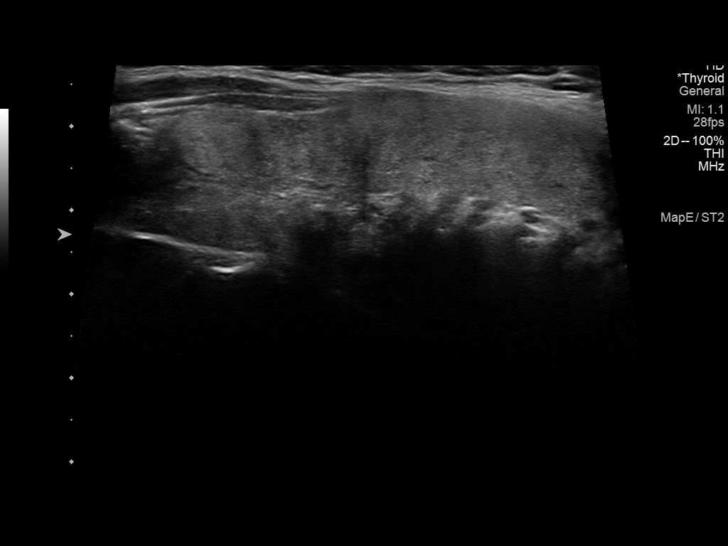
[im 31/53]
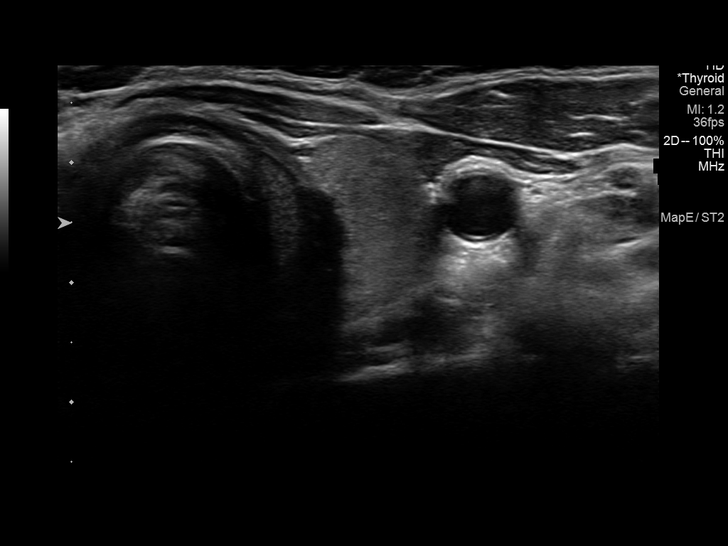
[im 35/53]
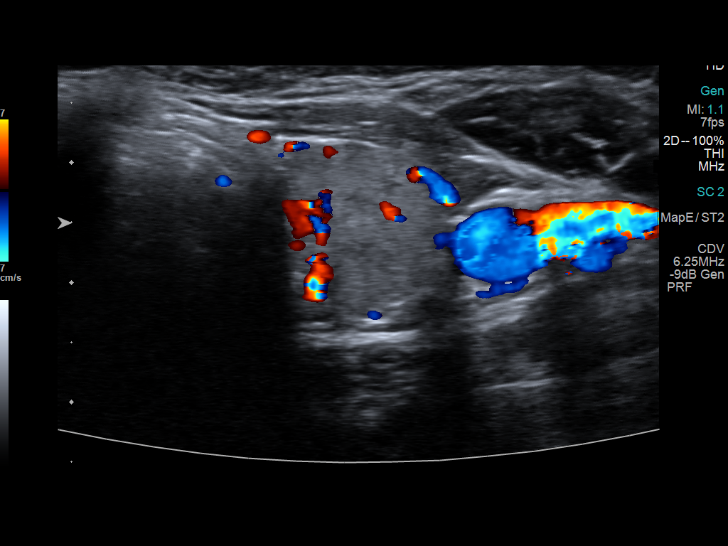
[im 40/53]
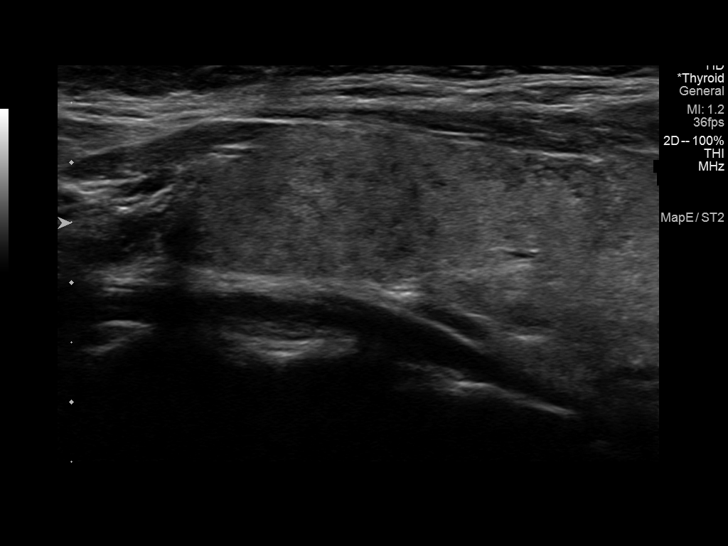
[im 44/53]
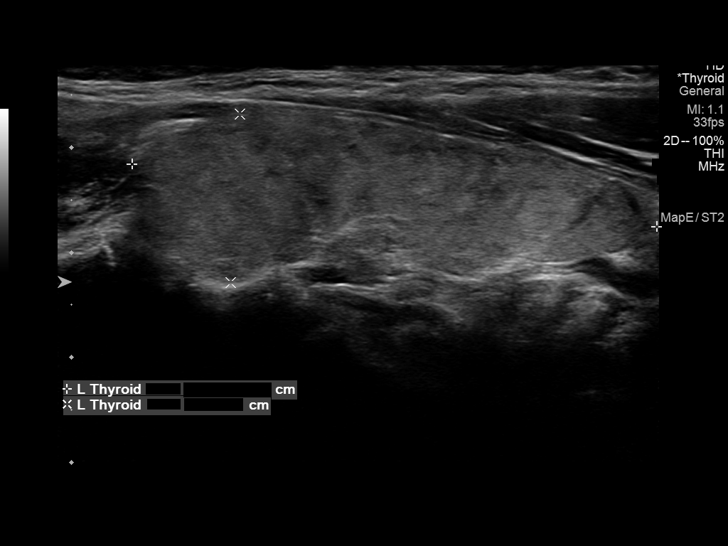
[im 48/53]
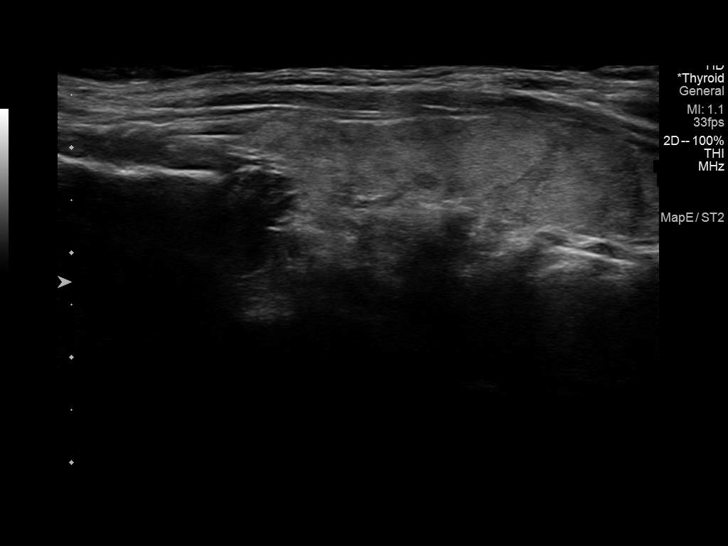
[im 53/53]
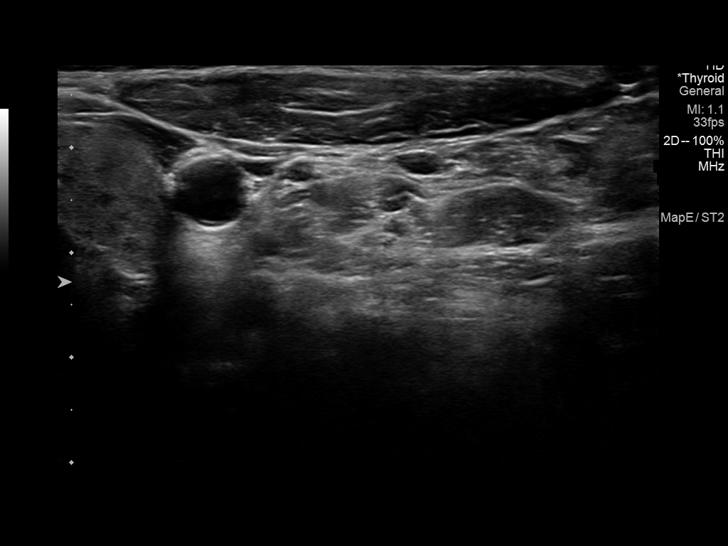

[13 of 25 positions shown; findings below may reference images not displayed]

FINDINGS: Parenchymal Echotexture: Mildly heterogenous

Isthmus: Enlarged measuring 1 cm in diameter, unchanged

Right lobe: Enlarged measuring 6.1 x 2.8 x 2.1 cm, previously, 6.0 x
1.8 x 2.3 cm

Left lobe: Enlarged measuring 5.0 x 1.6 x 1.4 cm, previously, 5.1 x
1.6 x 1.7 cm

_________________________________________________________

Estimated total number of nodules >/= 1 cm: 1

Number of spongiform nodules >/=  2 cm not described below (TR1): 0

Number of mixed cystic and solid nodules >/= 1.5 cm not described
below (TR2): 0

_________________________________________________________

The approximately 1.5 x 1.2 x 0.8 cm isoechoic ill-defined
nodule/pseudonodule within the thyroid isthmus is unchanged compared
to the [DATE] examination, previously, 1.5 x 1.2 x 0.8 cm.
IMPRESSION: 1. Similar findings of thyromegaly without worrisome new or
enlarging thyroid nodule.
2. The solitary approximately 1.5 cm nodule within the thyroid
isthmus is unchanged compared to the [DATE] examination. Stability
for greater than 5 years is indicative benign etiology and as such,
continued dedicated follow-up is not recommended.

The above is in keeping with the ACR TI-RADS recommendations - [HOSPITAL] 3617;[DATE].
